# Patient Record
Sex: Female | Born: 2000
Health system: Southern US, Community
[De-identification: ages and names within clinical notes are randomized; demographics above are authoritative.]

---
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
## 2021-02-21 ENCOUNTER — Emergency Department (HOSPITAL_COMMUNITY)
Admission: EM | Admit: 2021-02-21 | Discharge: 2021-02-22 | Disposition: A | Discharge status: Home / Self Care | Payer: BC Managed Care – PPO | Attending: Emergency Medicine | Admitting: Emergency Medicine

## 2021-02-21 ENCOUNTER — Encounter (HOSPITAL_COMMUNITY): Payer: Self-pay | Admitting: Emergency Medicine

## 2021-02-21 ENCOUNTER — Emergency Department (HOSPITAL_COMMUNITY): Payer: BC Managed Care – PPO

## 2021-02-21 ENCOUNTER — Other Ambulatory Visit: Payer: Self-pay

## 2021-02-21 DIAGNOSIS — R072 Precordial pain: Secondary | ICD-10-CM | POA: Insufficient documentation

## 2021-02-22 ENCOUNTER — Encounter (HOSPITAL_COMMUNITY): Payer: Self-pay | Admitting: Emergency Medicine

## 2021-02-22 LAB — TROPONIN I (HIGH SENSITIVITY): Troponin I (High Sensitivity): <2 ng/L (ref <18)

## 2021-02-22 LAB — HCG, QUANTITATIVE, PREGNANCY: hCG, Beta Chain, Quant, S: <1 m[IU]/mL (ref <5)

## 2021-02-22 MED ORDER — ALUM & MAG HYDROXIDE-SIMETH 200-200-20 MG/5ML PO SUSP
30.0000 mL | Freq: Once | ORAL | Status: AC
  Administered 2021-02-22: 30 mL via ORAL
  Filled 2021-02-22: qty 30
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
## 2021-12-14 ENCOUNTER — Encounter (HOSPITAL_BASED_OUTPATIENT_CLINIC_OR_DEPARTMENT_OTHER): Payer: Self-pay | Admitting: Emergency Medicine

## 2021-12-14 ENCOUNTER — Emergency Department (HOSPITAL_BASED_OUTPATIENT_CLINIC_OR_DEPARTMENT_OTHER)
Admission: EM | Admit: 2021-12-14 | Discharge: 2021-12-15 | Disposition: A | Discharge status: Home / Self Care | Payer: BC Managed Care – PPO | Attending: Emergency Medicine | Admitting: Emergency Medicine

## 2021-12-14 ENCOUNTER — Other Ambulatory Visit: Payer: Self-pay

## 2021-12-14 ENCOUNTER — Emergency Department (HOSPITAL_BASED_OUTPATIENT_CLINIC_OR_DEPARTMENT_OTHER): Payer: BC Managed Care – PPO

## 2021-12-14 DIAGNOSIS — R5383 Other fatigue: Secondary | ICD-10-CM | POA: Insufficient documentation

## 2021-12-14 DIAGNOSIS — L709 Acne, unspecified: Secondary | ICD-10-CM | POA: Insufficient documentation

## 2021-12-14 DIAGNOSIS — R0609 Other forms of dyspnea: Secondary | ICD-10-CM | POA: Diagnosis not present

## 2021-12-14 DIAGNOSIS — M546 Pain in thoracic spine: Secondary | ICD-10-CM | POA: Diagnosis not present

## 2021-12-14 DIAGNOSIS — M7918 Myalgia, other site: Secondary | ICD-10-CM | POA: Diagnosis not present

## 2021-12-14 DIAGNOSIS — E876 Hypokalemia: Secondary | ICD-10-CM | POA: Diagnosis not present

## 2021-12-14 DIAGNOSIS — M549 Dorsalgia, unspecified: Secondary | ICD-10-CM

## 2021-12-14 LAB — URINALYSIS, ROUTINE W REFLEX MICROSCOPIC
Bilirubin Urine: NEGATIVE
Glucose, UA: NEGATIVE mg/dL
Hgb urine dipstick: NEGATIVE
Ketones, ur: 40 mg/dL — AB
Leukocytes,Ua: NEGATIVE
Nitrite: NEGATIVE
Protein, ur: NEGATIVE mg/dL
Specific Gravity, Urine: >=1.03 (ref 1.005–1.030)
pH: 5.5 (ref 5.0–8.0)

## 2021-12-14 LAB — PREGNANCY, URINE: Preg Test, Ur: NEGATIVE

## 2021-12-14 NOTE — ED Provider Notes (Signed)
[facility] EMERGENCY DEPARTMENT Provider Note   CSN: [id number] Arrival date & time: 12/14/21  2019     History {Add pertinent medical, surgical, social history, OB history to HPI:1} Chief Complaint  Patient presents with   Back Pain    Darlene Smith is a 21 y.o. female.  The history is provided by the patient.  Back Pain She has no significant past history and comes in with complaints of generalized fatigue, dyspnea on exertion, upper back pain.  Fatigue and dyspnea have been progressively getting worse over the last 6 months.  She denies fever, chills, sweats.  Over the last 3 days, she has had a cough productive of a small amount of clear sputum and is developed the pain across her upper back.  That pain is worse when she takes a deep breath.  She denies any anterior chest pain.  She denies any nausea, vomiting, diaphoresis.  She has had some intermittent rashes over the past 6 months.  She did show me a picture of a nonspecific rash on her left breast which lasted about 2 weeks before resolving, was itchy and may have been a contact dermatitis.  He has had some intermittent myalgias but she denies any arthralgias.  She smokes marijuana daily but states she has not smoked cigarettes in the last 2 months.  As she denies history of travel, surgery, exogenous estrogen use.   Home Medications Prior to Admission medications   Medication Sig Start Date End Date Taking? Authorizing Provider  cefdinir (OMNICEF) 300 MG capsule Take 300 mg by mouth 2 (two) times daily.    [provider]  fluticasone (FLONASE) 50 MCG/ACT nasal spray Place 2 sprays into both nostrils daily. 09/03/16   Liberty Handy, PA-C  naproxen (NAPROSYN) 500 MG tablet Take 1 tablet (500 mg total) by mouth 2 (two) times daily with a meal. 06/27/19   Caccavale, Sophia, PA-C  omeprazole (PRILOSEC) 20 MG capsule Take 1 capsule (20 mg total) by mouth daily. 11/27/17   Horton, Mayer Masker, MD  ondansetron  (ZOFRAN ODT) 4 MG disintegrating tablet Take 1 tablet (4 mg total) by mouth every 8 (eight) hours as needed for nausea or vomiting. 09/03/16   Liberty Handy, PA-C  predniSONE (DELTASONE) 20 MG tablet Take 20 mg by mouth daily with breakfast.    [provider]      Allergies    Sulfa antibiotics    Review of Systems   Review of Systems  Musculoskeletal:  Positive for back pain.  All other systems reviewed and are negative.  Physical Exam Updated Vital Signs BP 130/72 (BP Location: Right Arm)   Pulse (!) 54   Temp 98.2 F (36.8 C) (Oral)   Resp 16   Ht 5\' 9"  (1.753 m)   Wt 68 kg   LMP 11/14/2021 (Exact Date)   SpO2 100%   BMI 22.15 kg/m  Physical Exam Vitals and nursing note reviewed.  21 year old female, resting comfortably and in no acute distress. Vital signs are significant for slightly slow heart rate. Oxygen saturation is 100%, which is normal. Head is normocephalic and atraumatic. PERRLA, EOMI. Oropharynx is clear. Neck is nontender and supple without adenopathy or JVD. Back is nontender and there is no CVA tenderness. Lungs are clear without rales, wheezes, or rhonchi. Chest is nontender. Heart has regular rate and rhythm without murmur. Abdomen is soft, flat, nontender. Extremities have no cyanosis or edema, full range of motion is present. Skin is warm  and dry.  Mild facial acne is present but no other rashes seen. Neurologic: Mental status is normal, cranial nerves are intact, moves all extremities equally.  ED Results / Procedures / Treatments   Labs (all labs ordered are listed, but only abnormal results are displayed) Labs Reviewed  URINALYSIS, ROUTINE W REFLEX MICROSCOPIC - Abnormal; Notable for the following components:      Result Value   Ketones, ur 40 (*)    All other components within normal limits  PREGNANCY, URINE    EKG None  Radiology DG Chest 2 View  Result Date: 12/14/2021 CLINICAL DATA:  Productive cough EXAM: CHEST - 2  VIEW COMPARISON:  02/21/2021 FINDINGS: The heart size and mediastinal contours are within normal limits. Both lungs are clear. The visualized skeletal structures are unremarkable. IMPRESSION: No active cardiopulmonary disease. Electronically Signed   By: Jasmine Pang M.D.   On: 12/14/2021 23:41    Procedures Procedures  {Document cardiac monitor, telemetry assessment procedure when appropriate:1}  Medications Ordered in ED Medications - No data to display  ED Course/ Medical Decision Making/ A&P                           Medical Decision Making Amount and/or Complexity of Data Reviewed Labs: ordered. Radiology: ordered.   Fatigue, dyspnea on exertion are nonspecific.  Differential diagnosis includes chronic fatigue syndrome, hepatitis, autoimmune disorders such as lupus, heart failure.  Current cough is nonspecific.  Chest x-ray is obtained to rule out pneumonia.  X-ray shows no evidence of pneumonia.  Have independently viewed the images, and agree with radiologist's interpretation.  Urinalysis was also obtained to look for evidence of occult UTI and, per my interpretation, is significant only for presence of ketones.  There is no evidence of UTI.  Pregnancy test is negative.  I have ordered CBC and comprehensive metabolic panel to look for evidence of hepatitis or other chronic illnesses.  Also, will check D-dimer to rule out pulmonary embolism and BNP heart failure.  Final Clinical Impression(s) / ED Diagnoses Final diagnoses:  None    Rx / DC Orders ED Discharge Orders     None

## 2021-12-14 NOTE — ED Notes (Signed)
Pt describes chronic fatigue with URI and congestion intermittently since November.  Pt also has back pain with this and reports she is concerned that she might have pneumonia.

## 2021-12-14 NOTE — ED Triage Notes (Signed)
Patient reports has had intermittent back pain, felt fatigue, and skin has been itchy since November. Patient c/o increased back pain.

## 2021-12-15 LAB — CBC WITH DIFFERENTIAL/PLATELET
Abs Immature Granulocytes: 0.01 10*3/uL (ref 0.00–0.07)
Basophils Absolute: 0.1 10*3/uL (ref 0.0–0.1)
Basophils Relative: 1 %
Eosinophils Absolute: 0.1 10*3/uL (ref 0.0–0.5)
Eosinophils Relative: 2 %
HCT: 37.4 % (ref 36.0–46.0)
Hemoglobin: 12.8 g/dL (ref 12.0–15.0)
Immature Granulocytes: 0 %
Lymphocytes Relative: 20 %
Lymphs Abs: 1.4 10*3/uL (ref 0.7–4.0)
MCH: 29.8 pg (ref 26.0–34.0)
MCHC: 34.2 g/dL (ref 30.0–36.0)
MCV: 87.2 fL (ref 80.0–100.0)
Monocytes Absolute: 0.5 10*3/uL (ref 0.1–1.0)
Monocytes Relative: 7 %
Neutro Abs: 5.1 10*3/uL (ref 1.7–7.7)
Neutrophils Relative %: 70 %
Platelets: 226 10*3/uL (ref 150–400)
RBC: 4.29 MIL/uL (ref 3.87–5.11)
RDW: 12.8 % (ref 11.5–15.5)
WBC: 7.2 10*3/uL (ref 4.0–10.5)
nRBC: 0 % (ref 0.0–0.2)

## 2021-12-15 LAB — COMPREHENSIVE METABOLIC PANEL
ALT: 27 U/L (ref 0–44)
AST: 33 U/L (ref 15–41)
Albumin: 4.1 g/dL (ref 3.5–5.0)
Alkaline Phosphatase: 47 U/L (ref 38–126)
Anion gap: 6 (ref 5–15)
BUN: 6 mg/dL (ref 6–20)
CO2: 23 mmol/L (ref 22–32)
Calcium: 8.7 mg/dL — ABNORMAL LOW (ref 8.9–10.3)
Chloride: 108 mmol/L (ref 98–111)
Creatinine, Ser: 0.52 mg/dL (ref 0.44–1.00)
GFR, Estimated: >60 mL/min (ref >60)
Glucose, Bld: 114 mg/dL — ABNORMAL HIGH (ref 70–99)
Potassium: 3.2 mmol/L — ABNORMAL LOW (ref 3.5–5.1)
Sodium: 137 mmol/L (ref 135–145)
Total Bilirubin: 0.7 mg/dL (ref 0.3–1.2)
Total Protein: 7.2 g/dL (ref 6.5–8.1)

## 2021-12-15 LAB — BRAIN NATRIURETIC PEPTIDE: B Natriuretic Peptide: 14.9 pg/mL (ref 0.0–100.0)

## 2021-12-15 LAB — D-DIMER, QUANTITATIVE: D-Dimer, Quant: <0.27 ug/mL-FEU (ref 0.00–0.50)

## 2021-12-15 MED ORDER — POTASSIUM CHLORIDE CRYS ER 20 MEQ PO TBCR: 20.0000 meq | EXTENDED_RELEASE_TABLET | Freq: Two times a day (BID) | ORAL | 0 refills | Status: DC

## 2021-12-15 MED ORDER — POTASSIUM CHLORIDE CRYS ER 20 MEQ PO TBCR
40.0000 meq | EXTENDED_RELEASE_TABLET | Freq: Once | ORAL | Status: AC
  Administered 2021-12-15: 40 meq via ORAL
  Filled 2021-12-15: qty 2

## 2021-12-15 NOTE — ED Notes (Signed)
Pt requesting water, ok per RN Alvino Chapel. Pt given the same.

## 2021-12-15 NOTE — Discharge Instructions (Addendum)
Your evaluation today did not show any evidence of pneumonia or any other serious condition.  However, you should talk with your primary care provider about your symptoms.  You may need additional testing which your primary care provider can arrange.  You may take ibuprofen or naproxen as needed for pain.  If you need additional pain relief, add acetaminophen.  When you combine acetaminophen with either ibuprofen or naproxen, you get better pain relief than you get from taking either medication by itself.

## 2022-06-23 IMAGING — CR DG CHEST 2V
2 series · 2 of 2 positions shown · non-contrast
Comparison: 02/21/2021

CLINICAL DATA: Productive cough

EXAM:
CHEST - 2 VIEW

[w chest pa]
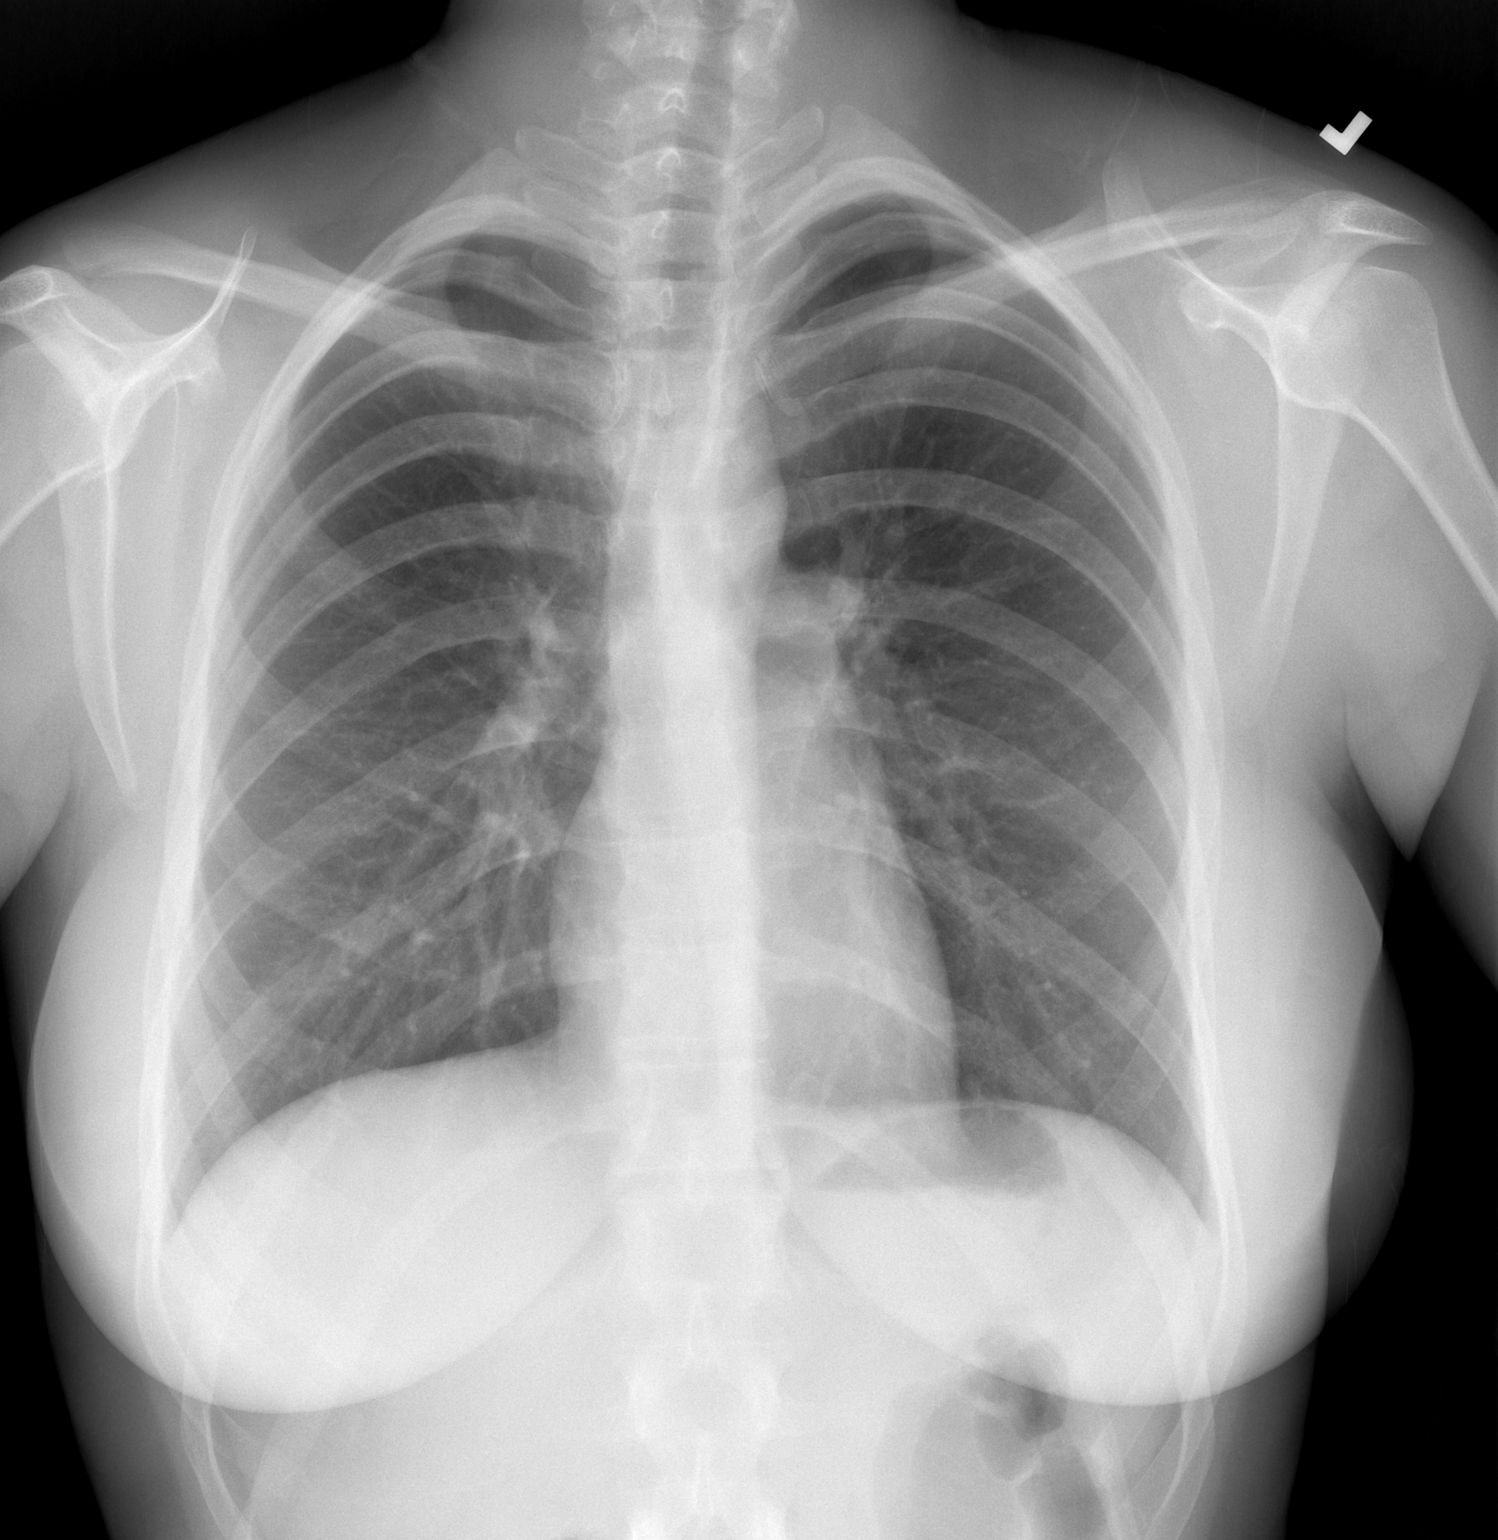

[w chest lat]
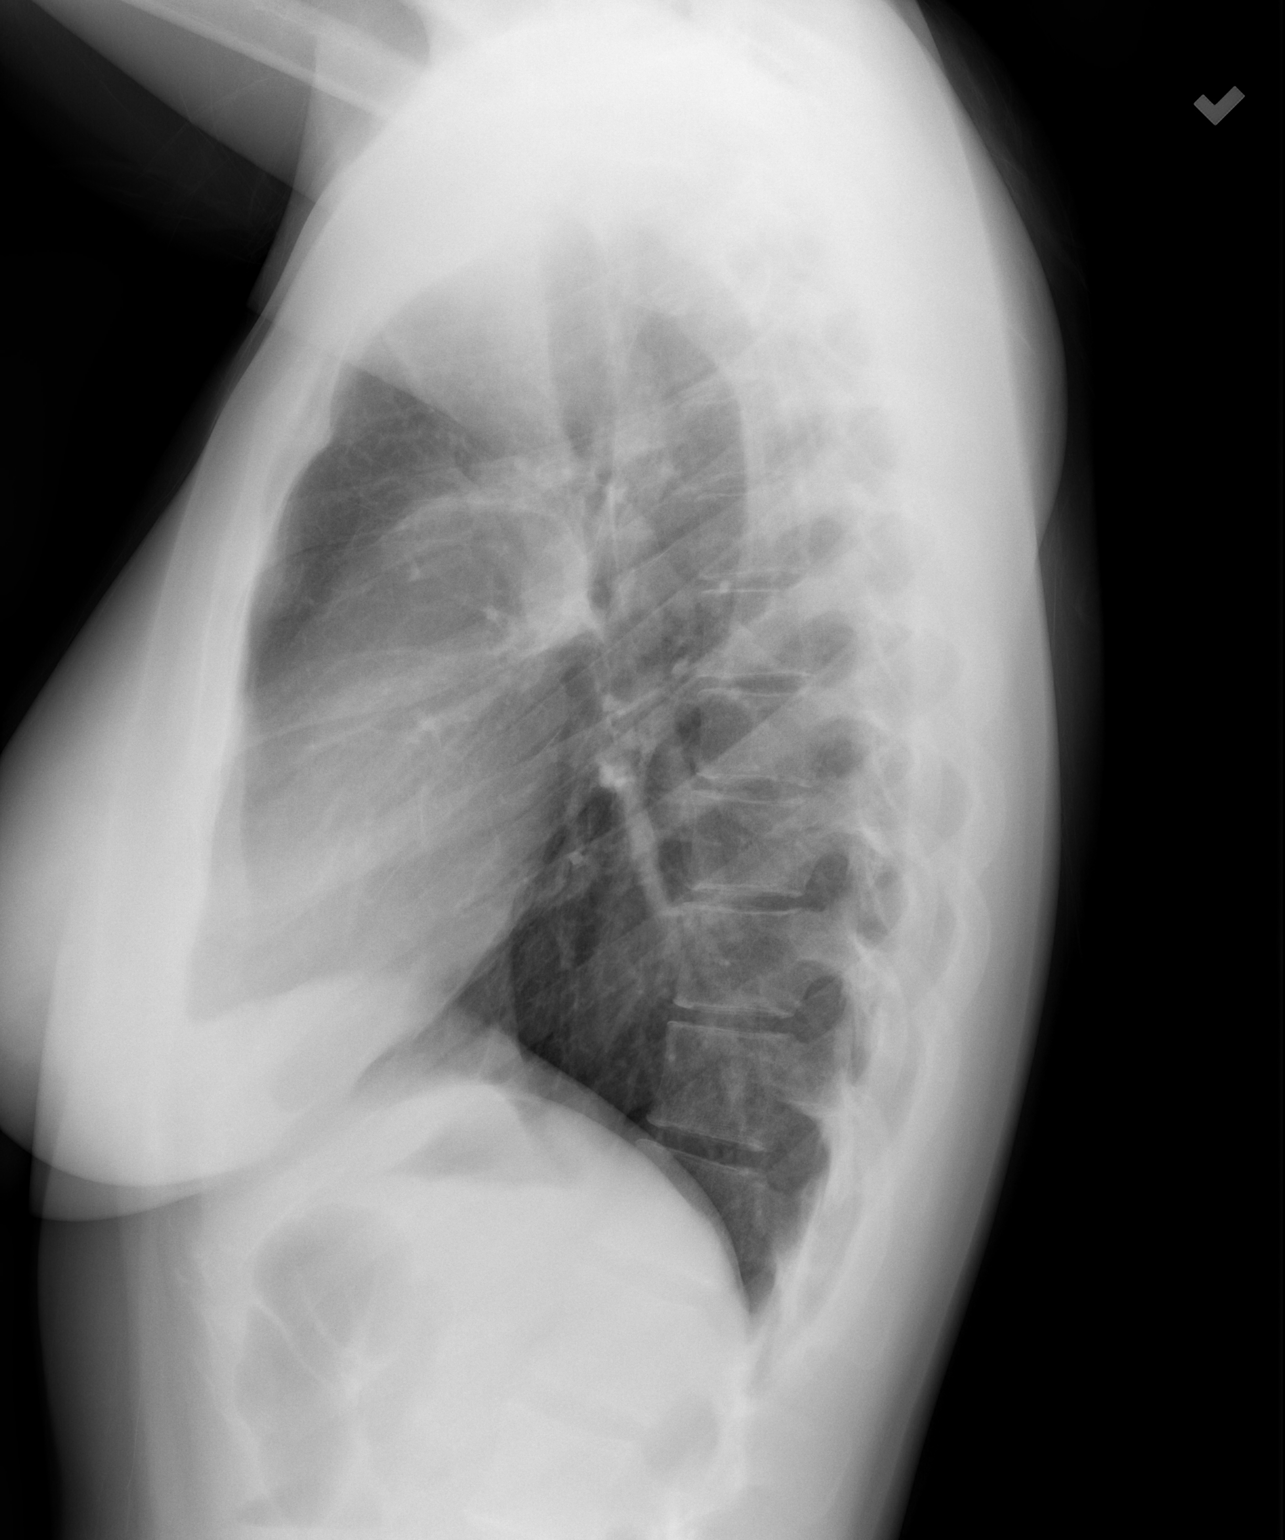

[2 of 2 positions shown; findings below may reference images not displayed]

FINDINGS: The heart size and mediastinal contours are within normal limits.
Both lungs are clear. The visualized skeletal structures are
unremarkable.
IMPRESSION: No active cardiopulmonary disease.

## 2022-10-29 ENCOUNTER — Emergency Department (HOSPITAL_BASED_OUTPATIENT_CLINIC_OR_DEPARTMENT_OTHER)
Admission: EM | Admit: 2022-10-29 | Discharge: 2022-10-29 | Disposition: A | Discharge status: Home / Self Care | Payer: BC Managed Care – PPO | Attending: Emergency Medicine | Admitting: Emergency Medicine

## 2022-10-29 ENCOUNTER — Encounter (HOSPITAL_BASED_OUTPATIENT_CLINIC_OR_DEPARTMENT_OTHER): Payer: Self-pay

## 2022-10-29 ENCOUNTER — Other Ambulatory Visit: Payer: Self-pay

## 2022-10-29 DIAGNOSIS — N3000 Acute cystitis without hematuria: Secondary | ICD-10-CM | POA: Insufficient documentation

## 2022-10-29 DIAGNOSIS — F17210 Nicotine dependence, cigarettes, uncomplicated: Secondary | ICD-10-CM | POA: Insufficient documentation

## 2022-10-29 DIAGNOSIS — R300 Dysuria: Secondary | ICD-10-CM | POA: Diagnosis present

## 2022-10-29 LAB — URINALYSIS, ROUTINE W REFLEX MICROSCOPIC
Bilirubin Urine: NEGATIVE
Glucose, UA: 100 mg/dL — AB
Hgb urine dipstick: NEGATIVE
Ketones, ur: NEGATIVE mg/dL
Nitrite: POSITIVE — AB
Protein, ur: NEGATIVE mg/dL
Specific Gravity, Urine: <=1.005 (ref 1.005–1.030)
pH: 5.5 (ref 5.0–8.0)

## 2022-10-29 LAB — PREGNANCY, URINE: Preg Test, Ur: NEGATIVE

## 2022-10-29 LAB — URINALYSIS, MICROSCOPIC (REFLEX): RBC / HPF: NONE SEEN RBC/hpf (ref 0–5)

## 2022-10-29 MED ORDER — CEPHALEXIN 250 MG PO CAPS
500.0000 mg | ORAL_CAPSULE | Freq: Once | ORAL | Status: AC
  Administered 2022-10-29: 500 mg via ORAL
  Filled 2022-10-29: qty 2

## 2022-10-29 MED ORDER — NAPROXEN 250 MG PO TABS
500.0000 mg | ORAL_TABLET | Freq: Once | ORAL | Status: AC
  Administered 2022-10-29: 500 mg via ORAL
  Filled 2022-10-29: qty 2

## 2022-10-29 MED ORDER — CEPHALEXIN 500 MG PO CAPS: 500.0000 mg | ORAL_CAPSULE | Freq: Four times a day (QID) | ORAL | 0 refills | Status: AC

## 2022-10-29 MED ORDER — NAPROXEN 500 MG PO TABS: 500.0000 mg | ORAL_TABLET | Freq: Two times a day (BID) | ORAL | 0 refills | Status: DC

## 2022-10-29 NOTE — ED Provider Notes (Signed)
[facility] Provider Note MRN:  [id number]  Arrival date & time: 10/29/22     Chief Complaint   Dysuria   History of Present Illness   Darlene ColanderKirstin Ballow is a 22 y.o. year-old female with no pertinent past medical history presenting to the ED with chief complaint of dysuria.  Burning discomfort, pain with urination for the past month.  Has been treated for yeast infection but this did not help.  Pain seems to be spreading to the bilateral flanks, right greater than left.  Discomfort in the suprapubic region worsening.  No fever.  No nausea vomiting or diarrhea.  No vaginal bleeding or discharge.  Review of Systems  A thorough review of systems was obtained and all systems are negative except as noted in the HPI and PMH.   Patient's Health History   History reviewed. No pertinent past medical history.  History reviewed. No pertinent surgical history.  History reviewed. No pertinent family history.  Social History   Socioeconomic History   Marital status: Single    Spouse name: Not on file   Number of children: Not on file   Years of education: Not on file   Highest education level: Not on file  Occupational History   Not on file  Tobacco Use   Smoking status: Some Days    Types: Cigarettes   Smokeless tobacco: Never  Vaping Use   Vaping Use: Former   Quit date: 10/14/2021  Substance and Sexual Activity   Alcohol use: Yes    Comment: occ   Drug use: Yes    Types: Marijuana, Cocaine   Sexual activity: Not on file  Other Topics Concern   Not on file  Social History Narrative   Not on file   Social Determinants of Health   Financial Resource Strain: Not on file  Food Insecurity: Not on file  Transportation Needs: Not on file  Physical Activity: Not on file  Stress: Not on file  Social Connections: Not on file  Intimate Partner Violence: Not on file     Physical Exam   Vitals:   10/29/22 2211  BP: (!) 122/56   Pulse: 64  Resp: 18  Temp: 97.6 F (36.4 C)  SpO2: 100%    CONSTITUTIONAL: Well-appearing, NAD NEURO/PSYCH:  Alert and oriented x 3, no focal deficits EYES:  eyes equal and reactive ENT/NECK:  no LAD, no JVD CARDIO: Regular rate, well-perfused, normal S1 and S2 PULM:  CTAB no wheezing or rhonchi GI/GU:  non-distended, non-tender MSK/SPINE:  No gross deformities, no edema SKIN:  no rash, atraumatic   *Additional and/or pertinent findings included in MDM below  Diagnostic and Interventional Summary    EKG Interpretation  Date/Time:    Ventricular Rate:    PR Interval:    QRS Duration:   QT Interval:    QTC Calculation:   R Axis:     Text Interpretation:         Labs Reviewed  URINALYSIS, ROUTINE W REFLEX MICROSCOPIC - Abnormal; Notable for the following components:      Result Value   Color, Urine ORANGE (*)    Glucose, UA 100 (*)    Nitrite POSITIVE (*)    Leukocytes,Ua TRACE (*)    All other components within normal limits  URINALYSIS, MICROSCOPIC (REFLEX) - Abnormal; Notable for the following components:   Bacteria, UA FEW (*)    All other components within normal limits  PREGNANCY, URINE    No orders  to display    Medications  cephALEXin (KEFLEX) capsule 500 mg (has no administration in time range)  naproxen (NAPROSYN) tablet 500 mg (has no administration in time range)     Procedures  /  Critical Care Procedures  ED Course and Medical Decision Making  Initial Impression and Ddx Dysuria, suspicious for UTI versus urethritis.  No vaginal bleeding or discharge.  Abdomen soft and nontender, no acute distress, ambulating in the room with normal vital signs.  Pyelonephritis also considered given the flank pain which is mild, doubt kidney stone.  Currently without indication for imaging.  Past medical/surgical history that increases complexity of ED encounter: None  Interpretation of Diagnostics I personally reviewed the urinalysis and my interpretation  is as follows: Nitrite positive, suspicious for urinary tract infection given the history, possibly pyelonephritis given the flank pain    Patient Reassessment and Ultimate Disposition/Management     Given patient's very reassuring exam and vital signs she is appropriate for discharge on antibiotics.  Patient management required discussion with the following services or consulting groups:  None  Complexity of Problems Addressed Acute complicated illness or Injury  Additional Data Reviewed and Analyzed Further history obtained from: Further history from spouse/family member  Additional Factors Impacting ED Encounter Risk Prescriptions  Elmer Sow. Pilar Plate, MD [facility] Emergency Medicine [facility] [email or web address]  Final Clinical Impressions(s) / ED Diagnoses     ICD-10-CM   1. Acute cystitis without hematuria  N30.00       ED Discharge Orders          Ordered    cephALEXin (KEFLEX) 500 MG capsule  4 times daily        10/29/22 2326    naproxen (NAPROSYN) 500 MG tablet  2 times daily        10/29/22 2326             Discharge Instructions Discussed with and Provided to Patient:    Discharge Instructions      You were evaluated in the Emergency Department and after careful evaluation, we did not find any emergent condition requiring admission or further testing in the hospital.  Your exam/testing today is overall reassuring.  Your symptoms are likely due to a urinary tract infection.  The infection has possibly spread to the kidney.  Important that you take the Keflex antibiotic as directed.  Use the Naprosyn twice daily as needed for pain.  You can continue using the phenazopyridine as needed for discomfort as well.  Please return to the Emergency Department if you experience any worsening of your condition.   Thank you for allowing [place] to be a part of your care.      Sabas Sous, MD 10/29/22 2330

## 2022-10-29 NOTE — ED Triage Notes (Signed)
Sts urinary frequency and dysuria for 1 month. Had visit 2 days ago for same but did not receive any medications for it.

## 2022-10-29 NOTE — Discharge Instructions (Addendum)
You were evaluated in the Emergency Department and after careful evaluation, we did not find any emergent condition requiring admission or further testing in the hospital.  Your exam/testing today is overall reassuring.  Your symptoms are likely due to a urinary tract infection.  The infection has possibly spread to the kidney.  Important that you take the Keflex antibiotic as directed.  Use the Naprosyn twice daily as needed for pain.  You can continue using the phenazopyridine as needed for discomfort as well.  Please return to the Emergency Department if you experience any worsening of your condition.   Thank you for allowing [place] to be a part of your care.

## 2022-11-01 ENCOUNTER — Emergency Department (HOSPITAL_BASED_OUTPATIENT_CLINIC_OR_DEPARTMENT_OTHER): Payer: BC Managed Care – PPO

## 2022-11-01 ENCOUNTER — Emergency Department (HOSPITAL_BASED_OUTPATIENT_CLINIC_OR_DEPARTMENT_OTHER)
Admission: EM | Admit: 2022-11-01 | Discharge: 2022-11-01 | Disposition: A | Discharge status: Home / Self Care | Payer: BC Managed Care – PPO | Attending: Emergency Medicine | Admitting: Emergency Medicine

## 2022-11-01 ENCOUNTER — Encounter (HOSPITAL_BASED_OUTPATIENT_CLINIC_OR_DEPARTMENT_OTHER): Payer: Self-pay | Admitting: Urology

## 2022-11-01 DIAGNOSIS — R300 Dysuria: Secondary | ICD-10-CM | POA: Diagnosis present

## 2022-11-01 DIAGNOSIS — N3090 Cystitis, unspecified without hematuria: Secondary | ICD-10-CM | POA: Insufficient documentation

## 2022-11-01 LAB — CBC WITH DIFFERENTIAL/PLATELET
Abs Immature Granulocytes: 0.01 10*3/uL (ref 0.00–0.07)
Basophils Absolute: 0 10*3/uL (ref 0.0–0.1)
Basophils Relative: 0 %
Eosinophils Absolute: 0.1 10*3/uL (ref 0.0–0.5)
Eosinophils Relative: 1 %
HCT: 36 % (ref 36.0–46.0)
Hemoglobin: 11.8 g/dL — ABNORMAL LOW (ref 12.0–15.0)
Immature Granulocytes: 0 %
Lymphocytes Relative: 30 %
Lymphs Abs: 2.1 10*3/uL (ref 0.7–4.0)
MCH: 28.2 pg (ref 26.0–34.0)
MCHC: 32.8 g/dL (ref 30.0–36.0)
MCV: 86.1 fL (ref 80.0–100.0)
Monocytes Absolute: 0.4 10*3/uL (ref 0.1–1.0)
Monocytes Relative: 6 %
Neutro Abs: 4.3 10*3/uL (ref 1.7–7.7)
Neutrophils Relative %: 63 %
Platelets: 235 10*3/uL (ref 150–400)
RBC: 4.18 MIL/uL (ref 3.87–5.11)
RDW: 13.7 % (ref 11.5–15.5)
WBC: 6.9 10*3/uL (ref 4.0–10.5)
nRBC: 0 % (ref 0.0–0.2)

## 2022-11-01 LAB — COMPREHENSIVE METABOLIC PANEL
ALT: 9 U/L (ref 0–44)
AST: 16 U/L (ref 15–41)
Albumin: 4.1 g/dL (ref 3.5–5.0)
Alkaline Phosphatase: 34 U/L — ABNORMAL LOW (ref 38–126)
Anion gap: 8 (ref 5–15)
BUN: 12 mg/dL (ref 6–20)
CO2: 21 mmol/L — ABNORMAL LOW (ref 22–32)
Calcium: 8.9 mg/dL (ref 8.9–10.3)
Chloride: 104 mmol/L (ref 98–111)
Creatinine, Ser: 0.65 mg/dL (ref 0.44–1.00)
GFR, Estimated: >60 mL/min (ref >60)
Glucose, Bld: 83 mg/dL (ref 70–99)
Potassium: 3.7 mmol/L (ref 3.5–5.1)
Sodium: 133 mmol/L — ABNORMAL LOW (ref 135–145)
Total Bilirubin: 0.6 mg/dL (ref 0.3–1.2)
Total Protein: 7.3 g/dL (ref 6.5–8.1)

## 2022-11-01 LAB — URINALYSIS, ROUTINE W REFLEX MICROSCOPIC
Bilirubin Urine: NEGATIVE
Glucose, UA: 100 mg/dL — AB
Hgb urine dipstick: NEGATIVE
Ketones, ur: NEGATIVE mg/dL
Leukocytes,Ua: NEGATIVE
Nitrite: POSITIVE — AB
Protein, ur: NEGATIVE mg/dL
Specific Gravity, Urine: 1.01 (ref 1.005–1.030)
pH: 6.5 (ref 5.0–8.0)

## 2022-11-01 LAB — URINALYSIS, MICROSCOPIC (REFLEX)

## 2022-11-01 LAB — WET PREP, GENITAL
Clue Cells Wet Prep HPF POC: NONE SEEN
Sperm: NONE SEEN
Trich, Wet Prep: NONE SEEN
WBC, Wet Prep HPF POC: <10 (ref <10)
Yeast Wet Prep HPF POC: NONE SEEN

## 2022-11-01 LAB — PREGNANCY, URINE: Preg Test, Ur: NEGATIVE

## 2022-11-01 MED ORDER — IOHEXOL 300 MG/ML  SOLN
100.0000 mL | Freq: Once | INTRAMUSCULAR | Status: AC | PRN
  Administered 2022-11-01: 100 mL via INTRAVENOUS

## 2022-11-01 MED ORDER — CIPROFLOXACIN HCL 500 MG PO TABS: 500.0000 mg | ORAL_TABLET | Freq: Two times a day (BID) | ORAL | 0 refills | Status: AC

## 2022-11-01 NOTE — ED Provider Notes (Signed)
[place] [facility] Provider Note   CSN: [id number] Arrival date & time: 11/01/22  1811     History  Chief Complaint  Patient presents with   Dysuria    Darlene Smith is a 22 y.o. female, no pertinent past medical history, who presents to the ED secondary to pelvic discomfort, increased frequency, urgency of urination, along with dysuria for the last month.  She states this has been going on for the last month, and her symptoms are not improving.  Saw someone on Friday, and was diagnosed with a UTI, started on Keflex, and Pyridium, she states that since then symptoms have not been improving.  He still having quite a bit of pain.  States it is mostly in bilateral lower quadrants and radiates to the back.  Was having some night sweats last night as well.  Of note her partner was recently diagnosed with herpes, and they have been sexually active.    Home Medications Prior to Admission medications   Medication Sig Start Date End Date Taking? Authorizing Provider  ciprofloxacin (CIPRO) 500 MG tablet Take 1 tablet (500 mg total) by mouth every 12 (twelve) hours for 7 days. 11/01/22 11/08/22 Yes Elgie Maziarz L, PA  cephALEXin (KEFLEX) 500 MG capsule Take 1 capsule (500 mg total) by mouth 4 (four) times daily for 10 days. 10/29/22 11/08/22  Sabas Sous, MD  fluticasone (FLONASE) 50 MCG/ACT nasal spray Place 2 sprays into both nostrils daily. 09/03/16   Liberty Handy, PA-C  naproxen (NAPROSYN) 500 MG tablet Take 1 tablet (500 mg total) by mouth 2 (two) times daily. 10/29/22   Sabas Sous, MD  omeprazole (PRILOSEC) 20 MG capsule Take 1 capsule (20 mg total) by mouth daily. 11/27/17   Horton, Mayer Masker, MD  ondansetron (ZOFRAN ODT) 4 MG disintegrating tablet Take 1 tablet (4 mg total) by mouth every 8 (eight) hours as needed for nausea or vomiting. 09/03/16   Liberty Handy, PA-C  potassium chloride SA (KLOR-CON M) 20 MEQ tablet Take 1 tablet (20 mEq  total) by mouth 2 (two) times daily. 12/15/21   Dione Booze, MD      Allergies    Sulfa antibiotics and Sulfamethoxazole    Review of Systems   Review of Systems  Genitourinary:  Positive for dysuria.    Physical Exam Updated Vital Signs BP 113/83 (BP Location: Left Arm)   Pulse 94   Temp 98 F (36.7 C)   Resp 18   Ht 5\' 9"  (1.753 m)   Wt 61.7 kg   LMP 10/15/2022 (Approximate)   SpO2 96%   BMI 20.08 kg/m  Physical Exam Vitals and nursing note reviewed. Exam conducted with a chaperone present.  Constitutional:      General: She is not in acute distress.    Appearance: She is well-developed.  HENT:     Head: Normocephalic and atraumatic.  Eyes:     Conjunctiva/sclera: Conjunctivae normal.  Cardiovascular:     Rate and Rhythm: Normal rate and regular rhythm.     Heart sounds: No murmur heard. Pulmonary:     Effort: Pulmonary effort is normal. No respiratory distress.     Breath sounds: Normal breath sounds.  Abdominal:     Palpations: Abdomen is soft.     Tenderness: There is no abdominal tenderness.  Genitourinary:    Vagina: Vaginal discharge present.     Cervix: No friability.     Comments: +mild erythema of cervical os, +  white discharge Musculoskeletal:        General: No swelling.     Cervical back: Neck supple.  Skin:    General: Skin is warm and dry.     Capillary Refill: Capillary refill takes less than 2 seconds.  Neurological:     Mental Status: She is alert.  Psychiatric:        Mood and Affect: Mood normal.     ED Results / Procedures / Treatments   Labs (all labs ordered are listed, but only abnormal results are displayed) Labs Reviewed  URINALYSIS, ROUTINE W REFLEX MICROSCOPIC - Abnormal; Notable for the following components:      Result Value   Color, Urine ORANGE (*)    Glucose, UA 100 (*)    Nitrite POSITIVE (*)    All other components within normal limits  URINALYSIS, MICROSCOPIC (REFLEX) - Abnormal; Notable for the following  components:   Bacteria, UA RARE (*)    All other components within normal limits  CBC WITH DIFFERENTIAL/PLATELET - Abnormal; Notable for the following components:   Hemoglobin 11.8 (*)    All other components within normal limits  COMPREHENSIVE METABOLIC PANEL - Abnormal; Notable for the following components:   Sodium 133 (*)    CO2 21 (*)    Alkaline Phosphatase 34 (*)    All other components within normal limits  WET PREP, GENITAL  PREGNANCY, URINE  GC/CHLAMYDIA PROBE AMP ([place]) NOT AT [facility]    EKG None  Radiology CT ABDOMEN PELVIS W CONTRAST  Result Date: 11/01/2022 CLINICAL DATA:  Acute abdominal pain EXAM: CT ABDOMEN AND PELVIS WITH CONTRAST TECHNIQUE: Multidetector CT imaging of the abdomen and pelvis was performed using the standard protocol following bolus administration of intravenous contrast. RADIATION DOSE REDUCTION: This exam was performed according to the departmental dose-optimization program which includes automated exposure control, adjustment of the mA and/or kV according to patient size and/or use of iterative reconstruction technique. CONTRAST:  OMNIPAQUE IOHEXOL 300 MG/ML  SOLN COMPARISON:  CT abdomen and pelvis 01/15/2017 FINDINGS: Lower chest: No acute abnormality. Hepatobiliary: No focal liver abnormality is seen. No gallstones, gallbladder wall thickening, or biliary dilatation. Pancreas: Unremarkable. No pancreatic ductal dilatation or surrounding inflammatory changes. Spleen: Normal in size without focal abnormality. Adrenals/Urinary Tract: Adrenal glands are unremarkable. Kidneys are normal, without renal calculi, focal lesion, or hydronephrosis. Bladder is unremarkable. Stomach/Bowel: Stomach is within normal limits. Appendix appears normal. No evidence of bowel wall thickening, distention, or inflammatory changes. There is a large amount of stool throughout the colon. Vascular/Lymphatic: No significant vascular findings are present. No enlarged abdominal  or pelvic lymph nodes. Reproductive: Uterus and bilateral adnexa are unremarkable. Dominant follicle noted in the right ovary. Other: No abdominal wall hernia or abnormality. No abdominopelvic ascites. Musculoskeletal: No acute or significant osseous findings. IMPRESSION: 1. No acute localizing process in the abdomen or pelvis. 2. Large amount of stool throughout the colon. Electronically Signed   By: Darliss Cheney M.D.   On: 11/01/2022 21:00    Procedures Procedures    Medications Ordered in ED Medications  iohexol (OMNIPAQUE) 300 MG/ML solution 100 mL (100 mLs Intravenous Contrast Given 11/01/22 2040)    ED Course/ Medical Decision Making/ A&P                             Medical Decision Making Patient is a 22 year old female, here for increased frequency urgency dysuria this morning for about a month.  She has had urine analysis, and started Pyridium and Keflex with no relief.  We will obtain a urinalysis and UPT, CT abdomen pelvis, to evaluate for possible stones, or large cyst, for further evaluation.  As well as do a pelvic and test for GC, wet prep.  Amount and/or Complexity of Data Reviewed Labs: ordered.    Details: Unremarkable, negative wet prep, urine grossly inaccurate given the use of Pyridium Radiology: ordered.    Details: CT abdomen pelvis shows no acute findings other than large amount of stool in colon. Discussion of management or test interpretation with external provider(s): Discussed with patient, urine difficult to read given use of Pyridium, we will treat her for UTI, with a ciprofloxacin, encouraged her to stop using Keflex Pyridium.  She is aware that she will need follow-up with the PCP.  And that her urine is not able to be cultured given her use of Pyridium, if her symptoms continue she will need to see a urologist for possible interstitial cystitis, and have her urine cultured.  We discussed the return precautions and she voiced understanding.  Was well-appearing on  exam.  She did not have any herpetic lesions on her cervix, and her cervix overall look good except for some mild inflammation.  She does not have any cervical motion tenderness thus I will not treat her for PID at this time.  Risk Prescription drug management.    Final Clinical Impression(s) / ED Diagnoses Final diagnoses:  Cystitis    Rx / DC Orders ED Discharge Orders          Ordered    ciprofloxacin (CIPRO) 500 MG tablet  Every 12 hours        11/01/22 2122              Pete PeltSmall, Hunt Zajicek L, PA 11/01/22 2125    Charlynne PanderYao, David Hsienta, MD 11/01/22 2337

## 2022-11-01 NOTE — Discharge Instructions (Addendum)
Please follow-up with your primary care doctor, we are unable to culture urine given your use of Azo.  I have prescribed a different antibiotic, for you, stop taking your Keflex, Pyridium, and the Azo prescribed by the urgent care.  If your pain continues, you will need to follow-up with another provider primary care doctor or the ER, you will need to likely have your urine cultured after you have been off Azo for a prolonged period of time.  Additionally it is possible you have interstitial cystitis, it is unclear, you will need to follow-up with the urologist regarding this, if it does not improve after taking the ciprofloxacin.  I provided you with information regarding this.

## 2022-11-01 NOTE — ED Notes (Signed)
Pt. Has been being treated for  a UTI and is not getting any better.  Pt. Reports having urinary frequency with odor to her urine.    Pt. Reports off and on low back pain.

## 2022-11-01 NOTE — ED Triage Notes (Signed)
Continued urinary symptoms after being seen Friday for same Dx with UTI, currently on antibiotics (keflex, naproxen, and Uro-MP) Had night sweats last night

## 2022-11-02 LAB — GC/CHLAMYDIA PROBE AMP (~~LOC~~) NOT AT ARMC
Chlamydia: NEGATIVE
Comment: NEGATIVE
Comment: NORMAL
Neisseria Gonorrhea: NEGATIVE

## 2023-03-08 ENCOUNTER — Other Ambulatory Visit: Payer: Self-pay

## 2023-03-08 ENCOUNTER — Encounter (HOSPITAL_BASED_OUTPATIENT_CLINIC_OR_DEPARTMENT_OTHER): Payer: Self-pay | Admitting: *Deleted

## 2023-03-08 ENCOUNTER — Emergency Department (HOSPITAL_BASED_OUTPATIENT_CLINIC_OR_DEPARTMENT_OTHER)
Admission: EM | Admit: 2023-03-08 | Discharge: 2023-03-08 | Disposition: A | Discharge status: Home / Self Care | Payer: BC Managed Care – PPO | Attending: Emergency Medicine | Admitting: Emergency Medicine

## 2023-03-08 DIAGNOSIS — R300 Dysuria: Secondary | ICD-10-CM | POA: Diagnosis present

## 2023-03-08 DIAGNOSIS — N3010 Interstitial cystitis (chronic) without hematuria: Secondary | ICD-10-CM | POA: Insufficient documentation

## 2023-03-08 LAB — BASIC METABOLIC PANEL
Anion gap: 9 (ref 5–15)
BUN: 6 mg/dL (ref 6–20)
CO2: 23 mmol/L (ref 22–32)
Calcium: 8.9 mg/dL (ref 8.9–10.3)
Chloride: 106 mmol/L (ref 98–111)
Creatinine, Ser: 0.59 mg/dL (ref 0.44–1.00)
GFR, Estimated: >60 mL/min (ref >60)
Glucose, Bld: 82 mg/dL (ref 70–99)
Potassium: 3.6 mmol/L (ref 3.5–5.1)
Sodium: 138 mmol/L (ref 135–145)

## 2023-03-08 LAB — URINALYSIS, W/ REFLEX TO CULTURE (INFECTION SUSPECTED)
Bilirubin Urine: NEGATIVE
Glucose, UA: NEGATIVE mg/dL
Ketones, ur: NEGATIVE mg/dL
Nitrite: POSITIVE — AB
Protein, ur: NEGATIVE mg/dL
Specific Gravity, Urine: 1.01 (ref 1.005–1.030)
pH: 7 (ref 5.0–8.0)

## 2023-03-08 LAB — CBC WITH DIFFERENTIAL/PLATELET
Abs Immature Granulocytes: 0.02 10*3/uL (ref 0.00–0.07)
Basophils Absolute: 0 10*3/uL (ref 0.0–0.1)
Basophils Relative: 0 %
Eosinophils Absolute: 0.1 10*3/uL (ref 0.0–0.5)
Eosinophils Relative: 1 %
HCT: 33.4 % — ABNORMAL LOW (ref 36.0–46.0)
Hemoglobin: 11.2 g/dL — ABNORMAL LOW (ref 12.0–15.0)
Immature Granulocytes: 0 %
Lymphocytes Relative: 26 %
Lymphs Abs: 1.8 10*3/uL (ref 0.7–4.0)
MCH: 28.6 pg (ref 26.0–34.0)
MCHC: 33.5 g/dL (ref 30.0–36.0)
MCV: 85.2 fL (ref 80.0–100.0)
Monocytes Absolute: 0.4 10*3/uL (ref 0.1–1.0)
Monocytes Relative: 6 %
Neutro Abs: 4.7 10*3/uL (ref 1.7–7.7)
Neutrophils Relative %: 67 %
Platelets: 206 10*3/uL (ref 150–400)
RBC: 3.92 MIL/uL (ref 3.87–5.11)
RDW: 14.3 % (ref 11.5–15.5)
WBC: 7 10*3/uL (ref 4.0–10.5)
nRBC: 0 % (ref 0.0–0.2)

## 2023-03-08 LAB — PREGNANCY, URINE: Preg Test, Ur: NEGATIVE

## 2023-03-08 LAB — WET PREP, GENITAL
Clue Cells Wet Prep HPF POC: NONE SEEN
Sperm: NONE SEEN
Trich, Wet Prep: NONE SEEN
WBC, Wet Prep HPF POC: >=10 — AB (ref <10)
Yeast Wet Prep HPF POC: NONE SEEN

## 2023-03-08 MED ORDER — HYDROXYZINE HCL 25 MG PO TABS: 25.0000 mg | ORAL_TABLET | Freq: Three times a day (TID) | ORAL | 0 refills | Status: DC | PRN

## 2023-03-08 MED ORDER — OXYCODONE HCL 5 MG PO TABS
5.0000 mg | ORAL_TABLET | Freq: Once | ORAL | Status: AC
  Administered 2023-03-08: 5 mg via ORAL
  Filled 2023-03-08 (×2): qty 1

## 2023-03-08 MED ORDER — HYDROXYZINE HCL 25 MG PO TABS
25.0000 mg | ORAL_TABLET | Freq: Once | ORAL | Status: AC
  Administered 2023-03-08: 25 mg via ORAL
  Filled 2023-03-08: qty 1

## 2023-03-08 MED ORDER — ACETAMINOPHEN 500 MG PO TABS
1000.0000 mg | ORAL_TABLET | Freq: Once | ORAL | Status: AC
  Administered 2023-03-08: 1000 mg via ORAL
  Filled 2023-03-08: qty 2

## 2023-03-08 MED ORDER — IBUPROFEN 400 MG PO TABS
600.0000 mg | ORAL_TABLET | Freq: Once | ORAL | Status: AC
  Administered 2023-03-08: 600 mg via ORAL
  Filled 2023-03-08: qty 1

## 2023-03-08 NOTE — Discharge Instructions (Addendum)
Thank you for coming to [facility]. You were seen for burning and frequency with urination. We did an exam, labs, and these showed no acute findings.  A urine culture and a G/C culture were also drawn.  You will be notified if these things are positive. You can alternate taking Tylenol and ibuprofen as needed for pain. You can take 650mg  tylenol (acetaminophen) every 4-6 hours, and 600 mg ibuprofen 3 times a day. You can also take hydroxyzine 25 mg every 8 hours as needed.  Please follow up with your primary care provider within 1-2 weeks.   Do not hesitate to return to the ED or call 911 if you experience: -Worsening symptoms -Lightheadedness, passing out -Fevers/chills -Anything else that concerns you

## 2023-03-08 NOTE — ED Provider Notes (Signed)
[place] [facility] Provider Note   CSN: [id number] Arrival date & time: 03/08/23  1757     History  Chief Complaint  Patient presents with   Cystitis    Darlene Smith is a 22 y.o. female with PMH as listed below who presents here by POV from UC, sent to ED from UC b/c of her discomfort, dysuria, frequency, some itching. H/o similar. Has seen urology for similar in past. States, "may have been dx'd with interstitial cystitis. Sx onset today at 1400. Took AZO at 1430. Denies fever,   Abdominal or pelvic pain, NVD, HA, dizziness. Endorses some intermittent vaginal itching but denies vaginal discharge.  She states she thinks she is having an interstitial cystitis flare which is the worst she has ever had.  She has not had a flare since she was presumptively diagnosed with IC and has never been treated for flare before. Sexually active only with her fiance, denies any recent new partners, no c/f STI. States hydroxyzine has helped in the past.   Per UC documentation from earlier today: 22 yo female reports hurts to sit because of increased pelvic pain. Denies STI concern and thinks IC flare. She states she has seen urology and was diagnosed with IC but was not given any direction or prescription to help with symptoms. She has been given hydroxyzine which has helped in the past. Denies discharge and notes LMP ended 2 days ago and was WNL. Only sexual partner is her fiance. Denies STI history. She took AZO today without any relief. Denies pain with intercourse.    History reviewed. No pertinent past medical history.     Home Medications Prior to Admission medications   Medication Sig Start Date End Date Taking? Authorizing Provider  hydrOXYzine (ATARAX) 25 MG tablet Take 1 tablet (25 mg total) by mouth every 8 (eight) hours as needed (for IC pain). 03/08/23 04/07/23 Yes Loetta Rough, MD  fluticasone (FLONASE) 50 MCG/ACT nasal spray Place 2 sprays into  both nostrils daily. 09/03/16   Liberty Handy, PA-C  naproxen (NAPROSYN) 500 MG tablet Take 1 tablet (500 mg total) by mouth 2 (two) times daily. 10/29/22   Sabas Sous, MD  omeprazole (PRILOSEC) 20 MG capsule Take 1 capsule (20 mg total) by mouth daily. 11/27/17   Horton, Mayer Masker, MD  ondansetron (ZOFRAN ODT) 4 MG disintegrating tablet Take 1 tablet (4 mg total) by mouth every 8 (eight) hours as needed for nausea or vomiting. 09/03/16   Liberty Handy, PA-C  potassium chloride SA (KLOR-CON M) 20 MEQ tablet Take 1 tablet (20 mEq total) by mouth 2 (two) times daily. 12/15/21   Dione Booze, MD      Allergies    Sulfa antibiotics and Sulfamethoxazole    Review of Systems   Review of Systems A 10 point review of systems was performed and is negative unless otherwise reported in HPI.  Physical Exam Updated Vital Signs BP 122/72 (BP Location: Left Arm)   Pulse (!) 58   Temp 98.3 F (36.8 C) (Oral)   Resp 18   LMP 02/15/2023 (Exact Date)   SpO2 98%  Physical Exam General: Normal appearing female, lying in bed.  HEENT: Sclera anicteric, MMM, trachea midline.  Cardiology: RRR Resp: Normal respiratory rate and effort.  Abd: Soft, non-tender, non-distended. No rebound tenderness or guarding.  Pelvic:  Performed with RN chaperone.  Normal-appearing external genitalia.  Small amount of brown blood in vaginal vault.  No  abnormal discharge or purulence noted in vaginal vault.  Normal-appearing cervix with closed os.  No purulence coming from os.  No CMT. MSK: No peripheral edema or signs of trauma. Extremities without deformity or TTP. No cyanosis or clubbing. Skin: warm, dry. Back: No CVA tenderness Neuro: A&Ox4, CNs II-XII grossly intact. MAEs. Sensation grossly intact.  Psych: Tearful affect  ED Results / Procedures / Treatments   Labs (all labs ordered are listed, but only abnormal results are displayed) Labs Reviewed  WET PREP, GENITAL - Abnormal; Notable for the following  components:      Result Value   WBC, Wet Prep HPF POC >=10 (*)    All other components within normal limits  URINALYSIS, W/ REFLEX TO CULTURE (INFECTION SUSPECTED) - Abnormal; Notable for the following components:   Hgb urine dipstick SMALL (*)    Nitrite POSITIVE (*)    Leukocytes,Ua TRACE (*)    Bacteria, UA FEW (*)    All other components within normal limits  CBC WITH DIFFERENTIAL/PLATELET - Abnormal; Notable for the following components:   Hemoglobin 11.2 (*)    HCT 33.4 (*)    All other components within normal limits  URINE CULTURE  PREGNANCY, URINE  BASIC METABOLIC PANEL  GC/CHLAMYDIA PROBE AMP ([place]) NOT AT [facility]    EKG None  Radiology No results found.  Procedures Pelvic exam  Date/Time: 03/08/2023 11:39 PM  Performed by: Loetta Rough, MD Authorized by: Loetta Rough, MD  Consent: Verbal consent obtained. Risks and benefits: risks, benefits and alternatives were discussed Consent given by: patient Required items: required blood products, implants, devices, and special equipment available Patient identity confirmed: verbally with patient Time out: Immediately prior to procedure a "time out" was called to verify the correct patient, procedure, equipment, support staff and site/side marked as required. Local anesthesia used: no  Anesthesia: Local anesthesia used: no  Sedation: Patient sedated: no  Patient tolerance: patient tolerated the procedure well with no immediate complications       Medications Ordered in ED Medications  hydrOXYzine (ATARAX) tablet 25 mg (25 mg Oral Given 03/08/23 2217)  acetaminophen (TYLENOL) tablet 1,000 mg (1,000 mg Oral Given 03/08/23 2216)  oxyCODONE (Oxy IR/ROXICODONE) immediate release tablet 5 mg (5 mg Oral Given 03/08/23 2217)  ibuprofen (ADVIL) tablet 600 mg (600 mg Oral Given 03/08/23 2216)    ED Course/ Medical Decision Making/ A&P                          Medical Decision Making Amount and/or Complexity  of Data Reviewed Labs: ordered. Decision-making details documented in ED Course.  Risk OTC drugs. Prescription drug management.    MDM:    Consider UTI/cystitis vs interstitial cystitis flare.  Patient states she has had the symptoms before but not so severe.  UA does not demonstrate any of UTI, urine cultures drawn.  Wet prep is negative and pelvic exam does not demonstrate any evidence of PID.  GC culture drawn for completeness sake though risk of STI per patient is low.  She is an otherwise healthy 22 year old and I have lower concern for genitourinary cancer.  She denies any urinary retention or feeling of incomplete urination, lower concern for bladder outlet obstruction.  She denies any abdominal pain or pelvic pain currently, unlikely to represent any ovarian torsion or pathology.  Patient likely with pain due to interstitial cystitis.  Patient's labs are reassuring.  She feels much better after hydroxyzine, Tylenol,  ibuprofen, and oxycodone.    Clinical Course as of 03/08/23 2338  Tue Mar 08, 2023  2132 WBC: 7.0 No leukocytosis [HN]  2132 Basic metabolic panel Normal renal function/electrolytes [HN]  2132 Urinalysis, w/ Reflex to Culture (Infection Suspected) -Urine, Clean Catch(!) +nitrites, few bacteria, few leukocytes, but no WBCs. No UTI. Will get urine culture as well given symptoms. [HN]  2314 Wet prep, genital(!) Neg wet prep. Will be DC'd with G/C pending, patient and fiance aware.  She feels much better after pain control.  Will be discharged with hydroxyzine 25 mg 3 times daily which she states helped significantly, and Tylenol ibuprofen.  Instructed to call her urologist in the morning to make a follow-up appointment.  Given discharge instructions and return precautions, all questions answered to patient satisfaction. [HN]    Clinical Course User Index [HN] Loetta Rough, MD    Labs: I Ordered, and personally interpreted labs.  The pertinent results include: Those  listed above  Additional history obtained from fianc at bedside, chart review.   Reevaluation: After the interventions noted above, I reevaluated the patient and found that they have :improved  Social Determinants of Health:  lives independently  Disposition: DC  Co morbidities that complicate the patient evaluation History reviewed. No pertinent past medical history.   Medicines Meds ordered this encounter  Medications   hydrOXYzine (ATARAX) tablet 25 mg   acetaminophen (TYLENOL) tablet 1,000 mg   oxyCODONE (Oxy IR/ROXICODONE) immediate release tablet 5 mg   ibuprofen (ADVIL) tablet 600 mg   hydrOXYzine (ATARAX) 25 MG tablet    Sig: Take 1 tablet (25 mg total) by mouth every 8 (eight) hours as needed (for IC pain).    Dispense:  30 tablet    Refill:  0    I have reviewed the patients home medicines and have made adjustments as needed  Problem List / ED Course: Problem List Items Addressed This Visit   None Visit Diagnoses     Pain due to interstitial cystitis    -  Primary                   This note was created using dictation software, which may contain spelling or grammatical errors.    Loetta Rough, MD 03/08/23 [id number]

## 2023-03-08 NOTE — ED Triage Notes (Signed)
Here by POV from UC, sent to ED from UC b/c of her discomfort, c/o constant bladder pain, dysuria, frequency, some itching. H/o similar. Has seen urology for similar in past. States, "may have been dx'd with interstitial cystitis. Sx onset today at 1400. Took AZO at 1430. Denies fever, NVD, HA, dizziness. Alert, restless, standing and rocking and bouncing.

## 2023-03-11 ENCOUNTER — Telehealth (HOSPITAL_BASED_OUTPATIENT_CLINIC_OR_DEPARTMENT_OTHER): Payer: Self-pay | Admitting: *Deleted

## 2023-03-11 NOTE — Progress Notes (Signed)
ED Antimicrobial Stewardship Positive Culture Follow Up   Darlene Smith is an 22 y.o. female who presented to [facility] on 03/08/2023 with a chief complaint of  Chief Complaint  Patient presents with   Cystitis    Recent Results (from the past 720 hour(s))  Urine Culture     Status: Abnormal   Collection Time: 03/08/23  9:33 PM   Specimen: Urine, Clean Catch  Result Value Ref Range Status   Specimen Description   Final    URINE, CLEAN CATCH Performed at [facility], [address] [place], [place] [address]    Special Requests   Final    NONE Performed at [facility], [address] [place], [place] [address]    Culture 50,000 COLONIES/mL STAPHYLOCOCCUS SAPROPHYTICUS (A)  Final   Report Status 03/10/2023 FINAL  Final   Organism ID, Bacteria STAPHYLOCOCCUS SAPROPHYTICUS (A)  Final      Susceptibility   Staphylococcus saprophyticus - MIC*    CIPROFLOXACIN <=0.5 SENSITIVE Sensitive     GENTAMICIN <=0.5 SENSITIVE Sensitive     NITROFURANTOIN <=16 SENSITIVE Sensitive     OXACILLIN SENSITIVE Sensitive     TETRACYCLINE >=16 RESISTANT Resistant     VANCOMYCIN <=0.5 SENSITIVE Sensitive     TRIMETH/SULFA <=10 SENSITIVE Sensitive     CLINDAMYCIN >=8 RESISTANT Resistant     RIFAMPIN <=0.5 SENSITIVE Sensitive     Inducible Clindamycin NEGATIVE Sensitive     * 50,000 COLONIES/mL STAPHYLOCOCCUS SAPROPHYTICUS  Wet prep, genital     Status: Abnormal   Collection Time: 03/08/23 11:00 PM  Result Value Ref Range Status   Yeast Wet Prep HPF POC NONE SEEN NONE SEEN Final   Trich, Wet Prep NONE SEEN NONE SEEN Final   Clue Cells Wet Prep HPF POC NONE SEEN NONE SEEN Final   WBC, Wet Prep HPF POC >=10 (A) <10 Final   Sperm NONE SEEN  Final    Comment: Performed at [facility], [address] [place], [place] [address]    Patient sent home with hydroxyzine for interstitial cystitis, no antibiotics. No treatment of bacteriuria currently indicated, follow  up outpatient with urology.  ED Provider: Ralph Leyden, PA-C   Romie Minus, PharmD PGY1 Pharmacy Resident Monday - Friday phone -  [phone] Saturday - Sunday phone - [phone]

## 2023-03-11 NOTE — Telephone Encounter (Signed)
Post ED Visit - Positive Culture Follow-up  Culture report reviewed by antimicrobial stewardship pharmacist: [facility] []  [address], Pharm.D. []  Celedonio Miyamoto, Pharm.D., BCPS AQ-ID []  Garvin Fila, Pharm.D., BCPS []  Georgina Pillion, [address].D., BCPS []  [place], [address].D., BCPS, AAHIVP []  Estella Husk, Pharm.D., BCPS, AAHIVP []  Lysle Pearl, PharmD, BCPS []  Phillips Climes, PharmD, BCPS []  Agapito Games, PharmD, BCPS []  Verlan Friends, PharmD []  Mervyn Gay, PharmD, BCPS []  Vinnie Level, PharmD  [facility] []  Len Childs, PharmD []  Greer Pickerel, PharmD []  Adalberto Cole, PharmD []  Perlie Gold, Rph []  Lonell Face) Jean Rosenthal, PharmD []  Earl Many, PharmD []  Junita Push, PharmD []  Dorna Leitz, PharmD []  Terrilee Files, PharmD []  Lynann Beaver, PharmD []  Keturah Barre, PharmD []  Loralee Pacas, PharmD []  Bernadene Person, PharmD   Positive urine culture Treated with Hydroxyzine and follow up with Urology and no further patient follow-up is required at this time.  Virl Axe [facility] 03/11/2023, 10:28 AM

## 2023-03-24 ENCOUNTER — Other Ambulatory Visit: Payer: Self-pay

## 2023-03-24 ENCOUNTER — Encounter (HOSPITAL_BASED_OUTPATIENT_CLINIC_OR_DEPARTMENT_OTHER): Payer: Self-pay | Admitting: Urology

## 2023-03-24 ENCOUNTER — Emergency Department (HOSPITAL_BASED_OUTPATIENT_CLINIC_OR_DEPARTMENT_OTHER)
Admission: EM | Admit: 2023-03-24 | Discharge: 2023-03-24 | Disposition: A | Discharge status: Home / Self Care | Payer: BC Managed Care – PPO | Attending: Emergency Medicine | Admitting: Emergency Medicine

## 2023-03-24 DIAGNOSIS — R300 Dysuria: Secondary | ICD-10-CM | POA: Diagnosis present

## 2023-03-24 LAB — WET PREP, GENITAL
Sperm: NONE SEEN
Trich, Wet Prep: NONE SEEN
WBC, Wet Prep HPF POC: <10 (ref <10)
Yeast Wet Prep HPF POC: NONE SEEN

## 2023-03-24 LAB — URINALYSIS, ROUTINE W REFLEX MICROSCOPIC
Bilirubin Urine: NEGATIVE
Glucose, UA: NEGATIVE mg/dL
Hgb urine dipstick: NEGATIVE
Ketones, ur: NEGATIVE mg/dL
Leukocytes,Ua: NEGATIVE
Nitrite: NEGATIVE
Protein, ur: NEGATIVE mg/dL
Specific Gravity, Urine: 1.01 (ref 1.005–1.030)
pH: 7 (ref 5.0–8.0)

## 2023-03-24 LAB — PREGNANCY, URINE: Preg Test, Ur: NEGATIVE

## 2023-03-24 NOTE — ED Provider Notes (Signed)
[place] [facility] AT [facility] Provider Note   CSN: [id number] Arrival date & time: 03/24/23  1749     History  Chief Complaint  Patient presents with   Dysuria    Darlene Smith is a 22 y.o. female.  Patient presents to the emergency department today for evaluation of dysuria.  Patient has had similar symptoms in the past.  She was prescribed a antibiotic 1 to 2 weeks ago for "5 days" which "muffled" her symptoms but did not complete because of the resolved.  She states that she went back and she still had bacteria in her urine so she was prescribed a "stronger" antibiotic.  She is nearing completion of this antibiotic and continues to have burning at the beginning and end of her urine stream.  No bleeding.  No back pain or fevers.  No vomiting.  No hematuria.  She is tried Azo without relief.  She states that she has seen a urologist in the past and they were "leaning towards interstitial cystitis".  She is sexually active with 1 partner.  Overall she feels she is low risk for STI.  She denies vaginal bleeding or discharge.       Home Medications Prior to Admission medications   Medication Sig Start Date End Date Taking? Authorizing Provider  fluticasone (FLONASE) 50 MCG/ACT nasal spray Place 2 sprays into both nostrils daily. 09/03/16   Liberty Handy, PA-C  hydrOXYzine (ATARAX) 25 MG tablet Take 1 tablet (25 mg total) by mouth every 8 (eight) hours as needed (for IC pain). 03/08/23 04/07/23  Loetta Rough, MD  naproxen (NAPROSYN) 500 MG tablet Take 1 tablet (500 mg total) by mouth 2 (two) times daily. 10/29/22   Sabas Sous, MD  omeprazole (PRILOSEC) 20 MG capsule Take 1 capsule (20 mg total) by mouth daily. 11/27/17   Horton, Mayer Masker, MD  ondansetron (ZOFRAN ODT) 4 MG disintegrating tablet Take 1 tablet (4 mg total) by mouth every 8 (eight) hours as needed for nausea or vomiting. 09/03/16   Liberty Handy, PA-C  potassium chloride SA (KLOR-CON  M) 20 MEQ tablet Take 1 tablet (20 mEq total) by mouth 2 (two) times daily. 12/15/21   Dione Booze, MD      Allergies    Sulfa antibiotics and Sulfamethoxazole    Review of Systems   Review of Systems  Physical Exam Updated Vital Signs BP (!) 102/56 (BP Location: Right Arm)   Pulse 61   Temp 98.8 F (37.1 C) (Oral)   Resp 18   Ht 5\' 9"  (1.753 m)   Wt 57.2 kg   LMP 02/15/2023 (Exact Date)   SpO2 98%   BMI 18.61 kg/m   Physical Exam Vitals and nursing note reviewed.  Constitutional:      Appearance: She is well-developed.  HENT:     Head: Normocephalic and atraumatic.  Eyes:     Conjunctiva/sclera: Conjunctivae normal.  Pulmonary:     Effort: No respiratory distress.  Abdominal:     Tenderness: There is no abdominal tenderness. There is no guarding or rebound.  Musculoskeletal:     Cervical back: Normal range of motion and neck supple.  Skin:    General: Skin is warm and dry.  Neurological:     Mental Status: She is alert.     ED Results / Procedures / Treatments   Labs (all labs ordered are listed, but only abnormal results are displayed) Labs Reviewed  WET PREP, GENITAL -  Abnormal; Notable for the following components:      Result Value   Clue Cells Wet Prep HPF POC PRESENT (*)    All other components within normal limits  URINE CULTURE  URINALYSIS, ROUTINE W REFLEX MICROSCOPIC  PREGNANCY, URINE  GC/CHLAMYDIA PROBE AMP ([place]) NOT AT [facility]    EKG None  Radiology No results found.  Procedures Procedures    Medications Ordered in ED Medications - No data to display  ED Course/ Medical Decision Making/ A&P    Patient seen and examined. History obtained directly from patient. Work-up including labs, imaging, EKG ordered in triage, if performed, were reviewed.    Labs/EKG: Independently reviewed and interpreted.  This included: UA and urine pregnancy were negative.  Added wet prep and GC/chlamydia, urine culture.  Imaging: None  ordered  Medications/Fluids: None ordered  Most recent vital signs reviewed and are as follows: BP (!) 102/56 (BP Location: Right Arm)   Pulse 61   Temp 98.8 F (37.1 C) (Oral)   Resp 18   Ht 5\' 9"  (1.753 m)   Wt 57.2 kg   LMP 02/15/2023 (Exact Date)   SpO2 98%   BMI 18.61 kg/m   Initial impression: Dysuria  9:48 PM Reassessment performed. Patient appears stable, comfortable.  Labs personally reviewed and interpreted including: Wet prep, agree with findings.  GC/chlamydia pending.  Reviewed pertinent lab work and imaging with patient at bedside. Questions answered.   Most current vital signs reviewed and are as follows: BP (!) 102/56 (BP Location: Right Arm)   Pulse 61   Temp 98.8 F (37.1 C) (Oral)   Resp 18   Ht 5\' 9"  (1.753 m)   Wt 57.2 kg   LMP 02/15/2023 (Exact Date)   SpO2 98%   BMI 18.61 kg/m   Plan: Discharge to home.   Prescriptions written for: None  Other home care instructions discussed: Monitor symptoms  ED return instructions discussed: Return with back pain, fever, vomiting, worsening abdominal pain  Follow-up instructions discussed: Patient encouraged to follow-up with their PCP in 3 days.                                  Medical Decision Making Amount and/or Complexity of Data Reviewed Labs: ordered.   Patient with dysuria despite recent treatment for UTI.  UA is clear today.  GC/chlamydia testing is pending.  Wet prep without signs of yeast infection or trichomonas.  Clue cells positive, however patient's overall symptom profile was not consistent with bacterial vaginosis and I have low likelihood of this.  Encourage PCP follow-up as needed.  Abdominal exam is reassuring.  Low concern for significant intra-abdominal etiology such as appendicitis or other intra-abdominal infection.  Low concern for pyelonephritis.        Final Clinical Impression(s) / ED Diagnoses Final diagnoses:  Dysuria    Rx / DC Orders ED Discharge Orders      None         Renne Crigler, Cordelia Poche 03/24/23 2150    Arby Barrette, MD 04/08/23 1816

## 2023-03-24 NOTE — ED Triage Notes (Signed)
Dx with UTI 2 weeks ago, has been through 2 courses of antibiotics, still has occasional pain  States lower back pain intermittent x 1 week now  Denies fever

## 2023-03-24 NOTE — Discharge Instructions (Signed)
Please read and follow all provided instructions.  Your diagnoses today include:  1. Dysuria     Tests performed today include: Urine test -is clear today without signs of infection Urine culture was sent and is pending, you will be notified with any positive results suggestive of infection. Test for gonorrhea and chlamydia is pending Wet prep did not show trichomonas or yeast infection.  Was positive for clue cells but clinically you do not have symptoms which would suggest bacterial vaginosis. Vital signs. See below for your results today.   Medications prescribed:  None  Home care instructions:  Follow any educational materials contained in this packet.  Follow-up instructions: Please follow-up with your primary care provider in 3 days if symptoms are not resolved for further evaluation of your symptoms.  Return instructions:  Please return to the Emergency Department if you experience worsening symptoms.  Return with fever, worsening pain, persistent vomiting, worsening pain in your back.  Please return if you have any other emergent concerns.  Additional Information:  Your vital signs today were: BP (!) 102/56 (BP Location: Right Arm)   Pulse 61   Temp 98.8 F (37.1 C) (Oral)   Resp 18   Ht 5\' 9"  (1.753 m)   Wt 57.2 kg   LMP 02/15/2023 (Exact Date)   SpO2 98%   BMI 18.61 kg/m  If your blood pressure (BP) was elevated above 135/85 this visit, please have this repeated by your doctor within one month. --------------

## 2023-03-25 LAB — URINE CULTURE: Culture: NO GROWTH

## 2023-03-31 LAB — GC/CHLAMYDIA PROBE AMP (~~LOC~~) NOT AT ARMC
Chlamydia: NEGATIVE
Comment: NEGATIVE
Comment: NORMAL
Neisseria Gonorrhea: NEGATIVE

## 2023-04-01 ENCOUNTER — Ambulatory Visit (INDEPENDENT_AMBULATORY_CARE_PROVIDER_SITE_OTHER): Payer: BC Managed Care – PPO | Admitting: Physician Assistant

## 2023-04-01 ENCOUNTER — Encounter: Payer: Self-pay | Admitting: Physician Assistant

## 2023-04-01 VITALS — BP 108/62 | HR 63 | Temp 98.2°F | Resp 20 | Ht 68.0 in | Wt 125.6 lb

## 2023-04-01 DIAGNOSIS — N760 Acute vaginitis: Secondary | ICD-10-CM | POA: Diagnosis not present

## 2023-04-01 DIAGNOSIS — D649 Anemia, unspecified: Secondary | ICD-10-CM | POA: Diagnosis not present

## 2023-04-01 DIAGNOSIS — B9689 Other specified bacterial agents as the cause of diseases classified elsewhere: Secondary | ICD-10-CM | POA: Diagnosis not present

## 2023-04-01 DIAGNOSIS — Z23 Encounter for immunization: Secondary | ICD-10-CM | POA: Diagnosis not present

## 2023-04-01 DIAGNOSIS — N3010 Interstitial cystitis (chronic) without hematuria: Secondary | ICD-10-CM | POA: Diagnosis not present

## 2023-04-01 NOTE — Progress Notes (Signed)
New patient visit   Patient: Darlene Smith   DOB: 2000/10/12   22 y.o. Female  MRN: [id number] Visit Date: 04/01/2023  Today's healthcare provider: Alfredia Ferguson, PA-C   Cc. New patient  Subjective    Darlene Smith is a 23 y.o. female who presents today as a new patient to establish care.  HPI  Pt was seen in the ED 8/29 for dysuria, urine culture negative, but wet prep + for clue cells but given symptoms she was not treated.   In the last month she was treated with macrobid, cefodoxime. Her lasturine culture grew staph saprophyticus.   Discussed the use of AI scribe software for clinical note transcription with the patient, who gave verbal consent to proceed.  History of Present Illness   The patient, with a history of interstitial cystitis presents with ongoing bladder issues. The patient describes the bladder problem as chronic inflammation that flares up with certain foods, particularly spicy and heavily processed foods. The flare-ups are usually mild and can be managed by drinking lots of water. However, the patient recently had a severe flare-up that was later diagnosed as a UTI.    The patient also reports changes in her fingers, which she describes as becoming more round and having a dip on the outside. Concerned about finger clubbing. The patient denies any pain in her fingers.      History reviewed. No pertinent past medical history. History reviewed. No pertinent surgical history. Family Status  Relation Name Status   Mother  Alive   Father  Alive  No partnership data on file   History reviewed. No pertinent family history. Social History   Socioeconomic History   Marital status: Significant Other    Spouse name: Not on file   Number of children: Not on file   Years of education: Not on file   Highest education level: Not on file  Occupational History   Not on file  Tobacco Use   Smoking status: Some Days    Types: Cigarettes   Smokeless tobacco:  Never  Vaping Use   Vaping status: Former   Quit date: 10/14/2021  Substance and Sexual Activity   Alcohol use: Yes    Comment: occ   Drug use: Yes    Types: Marijuana, Cocaine   Sexual activity: Not on file  Other Topics Concern   Not on file  Social History Narrative   Not on file   Social Determinants of Health   Financial Resource Strain: Not on file  Food Insecurity: Not on file  Transportation Needs: Not on file  Physical Activity: Not on file  Stress: Not on file  Social Connections: Unknown (12/08/2021)   Received from [facility] [address], [facility]   Social Network    Social Network: Not on file   Outpatient Medications Prior to Visit  Medication Sig   AUROVELA 24 FE 1-20 MG-MCG(24) tablet Take 1 tablet by mouth daily.   fluconazole (DIFLUCAN) 150 MG tablet Take 150 mg by mouth once.   metroNIDAZOLE (FLAGYL) 500 MG tablet Take 500 mg by mouth 3 (three) times daily.   [DISCONTINUED] fluticasone (FLONASE) 50 MCG/ACT nasal spray Place 2 sprays into both nostrils daily.   [DISCONTINUED] hydrOXYzine (ATARAX) 25 MG tablet Take 1 tablet (25 mg total) by mouth every 8 (eight) hours as needed (for IC pain).   [DISCONTINUED] naproxen (NAPROSYN) 500 MG tablet Take 1 tablet (500 mg total) by mouth 2 (two) times daily.   [DISCONTINUED] omeprazole (PRILOSEC)  20 MG capsule Take 1 capsule (20 mg total) by mouth daily.   [DISCONTINUED] ondansetron (ZOFRAN ODT) 4 MG disintegrating tablet Take 1 tablet (4 mg total) by mouth every 8 (eight) hours as needed for nausea or vomiting.   [DISCONTINUED] potassium chloride SA (KLOR-CON M) 20 MEQ tablet Take 1 tablet (20 mEq total) by mouth 2 (two) times daily.   No facility-administered medications prior to visit.   Allergies  Allergen Reactions   Sulfa Antibiotics Anaphylaxis, Rash and Shortness Of Breath    Blood clotting issues per mother "blood won't clot"    Sulfamethoxazole Rash and Shortness Of Breath    "blood won't clot"     Immunization History  Administered Date(s) Administered   DTaP 04/04/2001, 06/12/2001, 08/07/2001, 09/18/2002, 08/10/2005   Dtap, Unspecified 07/07/2006   HIB, Unspecified 04/04/2001, 06/12/2001, 08/07/2001, 05/24/2002   HPV 9-valent 04/01/2023   Hep B, Unspecified 2000-12-31, 04/04/2001, 03/29/2002   IPV 04/04/2001, 06/12/2001, 09/18/2002, 08/10/2005   MMR 05/24/2002, 07/07/2006   MMRV 08/10/2005   Meningococcal Acwy, Unspecified 11/03/2011, 06/09/2017   Meningococcal Conjugate 06/09/2017   PFIZER Comirnaty(Gray Top)Covid-19 Tri-Sucrose Vaccine 11/01/2019, 11/22/2019   PPD Test 11/10/2022   Pneumococcal Conjugate PCV 7 04/04/2001, 06/12/2001, 08/07/2001, 03/29/2002   Polio, Unspecified 07/07/2006   Td 04/01/2023   Tdap 09/27/2011   Varicella 03/29/2002, 04/29/2014    Health Maintenance  Topic Date Due   HIV Screening  Never done   Hepatitis C Screening  Never done   PAP-Cervical Cytology Screening  Never done   PAP SMEAR-Modifier  Never done   INFLUENZA VACCINE  Never done   COVID-19 Vaccine (3 - 2023-24 season) 03/27/2023   HPV VACCINES (2 - 3-dose series) 04/29/2023   DTaP/Tdap/Td (8 - Td or Tdap) 03/31/2033    Patient Care Team: Alfredia Ferguson, PA-C as PCP - General (Physician Assistant)  Review of Systems  Constitutional:  Negative for fatigue and fever.  Respiratory:  Negative for cough and shortness of breath.   Cardiovascular:  Negative for chest pain and leg swelling.  Gastrointestinal:  Negative for abdominal pain.  Genitourinary:  Positive for frequency and urgency.  Neurological:  Negative for dizziness and headaches.      Objective    BP 108/62 (BP Location: Left Arm, Patient Position: Sitting, Cuff Size: Small)   Pulse 63   Temp 98.2 F (36.8 C) (Oral)   Resp 20   Ht 5\' 8"  (1.727 m)   Wt 125 lb 9.6 oz (57 kg)   LMP 02/15/2023 (Exact Date)   SpO2 99%   BMI 19.10 kg/m   Physical Exam Constitutional:      General: She is awake.      Appearance: She is well-developed.  HENT:     Head: Normocephalic.  Eyes:     Conjunctiva/sclera: Conjunctivae normal.  Cardiovascular:     Rate and Rhythm: Normal rate and regular rhythm.     Heart sounds: Normal heart sounds.  Pulmonary:     Effort: Pulmonary effort is normal.     Breath sounds: Normal breath sounds.  Musculoskeletal:     Comments: B/l hands appear normal. No clubbing at fingers  Skin:    General: Skin is warm.  Neurological:     Mental Status: She is alert and oriented to person, place, and time.  Psychiatric:        Attention and Perception: Attention normal.        Mood and Affect: Mood normal.        Speech: Speech normal.  Behavior: Behavior is cooperative.     Depression Screen    04/01/2023    8:47 AM  PHQ 2/9 Scores  PHQ - 2 Score 1   No results found for any visits on 04/01/23.  Assessment & Plan     1. Interstitial cystitis -Continue avoidance of trigger foods and maintain high water intake. -Consider use of hydroxyzine as needed for severe flare-ups. -Consider daily antihistamine (e.g., Zyrtec or Claritin) for management. -Consider referral to specialty group or urogynecologist if symptoms become unmanageable.  2. Anemia, unspecified type Recent blood work showed mild anemia. No heavy periods. Reports frequent tiredness and feeling cold. -Start iron supplement or increase dietary iron intake (leafy greens, meat).  3. BV (bacterial vaginosis) On review of recent ED visit-- positive for BV with clue cells seen -Pt already picked up course of flagyl and diflucan   Nail Changes -No intervention needed at this time. Monitor for any further changes or development of pain.  General Health Maintenance -Administer tetanus and first dose of HPV (Gardasil) vaccine today. Schedule follow-up doses of HPV vaccine in 1 month and 6 months. -Consider routine physical later in the year or next summer, in line with timing of last blood  work. -Ensure regular Pap smears with gynecologist.    4. Need for vaccine for DT (diphtheria-tetanus) - Td : Tetanus/diphtheria >7yo Preservative  free  5. Need for HPV vaccine - HPV 9-valent vaccine,Recombinat  Assessment and Plan     Return in about 6 months (around 09/29/2023) for CPE.     I, Alfredia Ferguson, PA-C have reviewed all documentation for this visit. The documentation on  04/01/23   for the exam, diagnosis, procedures, and orders are all accurate and complete.   Alfredia Ferguson, PA-C  [facility] [phone] (phone) [phone] (fax)  [facility]

## 2023-05-02 ENCOUNTER — Encounter: Payer: Self-pay | Admitting: Physician Assistant

## 2023-05-02 ENCOUNTER — Ambulatory Visit (INDEPENDENT_AMBULATORY_CARE_PROVIDER_SITE_OTHER): Payer: BC Managed Care – PPO | Admitting: Family Medicine

## 2023-05-02 ENCOUNTER — Encounter: Payer: Self-pay | Admitting: Family Medicine

## 2023-05-02 VITALS — BP 98/70 | HR 72 | Temp 98.5°F | Resp 18 | Ht 68.0 in | Wt 122.2 lb

## 2023-05-02 DIAGNOSIS — N3000 Acute cystitis without hematuria: Secondary | ICD-10-CM

## 2023-05-02 DIAGNOSIS — R300 Dysuria: Secondary | ICD-10-CM

## 2023-05-02 LAB — POC URINALSYSI DIPSTICK (AUTOMATED)
Bilirubin, UA: NEGATIVE
Glucose, UA: NEGATIVE
Ketones, UA: NEGATIVE
Leukocytes, UA: NEGATIVE
Nitrite, UA: NEGATIVE
Protein, UA: NEGATIVE
Spec Grav, UA: 1.01 (ref 1.010–1.025)
Urobilinogen, UA: 0.2 U/dL
pH, UA: 5 (ref 5.0–8.0)

## 2023-05-02 MED ORDER — PHENAZOPYRIDINE HCL 200 MG PO TABS
200.0000 mg | ORAL_TABLET | Freq: Three times a day (TID) | ORAL | 3 refills | Status: AC | PRN
Start: 2023-05-02 — End: ?

## 2023-05-02 MED ORDER — NITROFURANTOIN MONOHYD MACRO 100 MG PO CAPS
100.0000 mg | ORAL_CAPSULE | Freq: Two times a day (BID) | ORAL | 0 refills | Status: AC
Start: 2023-05-02 — End: ?

## 2023-05-02 NOTE — Telephone Encounter (Signed)

## 2023-05-02 NOTE — Telephone Encounter (Signed)
Please disregard encounter. Pt in office seeing another provider today.

## 2023-05-02 NOTE — Progress Notes (Signed)
Established Patient Office Visit  Subjective   Patient ID: Darlene Smith, female    DOB: July 28, 2000  Age: 22 y.o. MRN: [id number]  Chief Complaint  Patient presents with   Dysuria    Sxs started few days ago. Pt states having burning and pain that started this morning.     HPI Discussed the use of AI scribe software for clinical note transcription with the patient, who gave verbal consent to proceed.  History of Present Illness   The patient, with a history of recurrent urinary tract infections (UTIs) and interstitial cystitis, presents with recent onset of cloudy urine. Initially, she did not experience any discomfort and attempted to manage the symptoms with increased water intake. However, this morning she experienced a burning sensation during urination. The urine was described as 'super cloudy' and had a noticeable odor. After urination, the burning sensation subsided. She denies any unusual discharge, but notes that she is currently at the end of her menstrual cycle. She has a known history of interstitial cystitis, but she is able to differentiate between the symptoms of a UTI and a flare-up of interstitial cystitis. She has not consumed any spicy or acidic foods recently, which she knows can exacerbate her interstitial cystitis.      Patient Active Problem List   Diagnosis Date Noted   Interstitial cystitis 04/01/2023   No past medical history on file. No past surgical history on file. Social History   Tobacco Use   Smoking status: Some Days    Types: Cigarettes   Smokeless tobacco: Never  Vaping Use   Vaping status: Former   Quit date: 10/14/2021  Substance Use Topics   Alcohol use: Yes    Comment: occ   Drug use: Yes    Types: Marijuana, Cocaine   Social History   Socioeconomic History   Marital status: Significant Other    Spouse name: Not on file   Number of children: Not on file   Years of education: Not on file   Highest education level: Not on file   Occupational History   Not on file  Tobacco Use   Smoking status: Some Days    Types: Cigarettes   Smokeless tobacco: Never  Vaping Use   Vaping status: Former   Quit date: 10/14/2021  Substance and Sexual Activity   Alcohol use: Yes    Comment: occ   Drug use: Yes    Types: Marijuana, Cocaine   Sexual activity: Not on file  Other Topics Concern   Not on file  Social History Narrative   Not on file   Social Determinants of Health   Financial Resource Strain: Not on file  Food Insecurity: Not on file  Transportation Needs: Not on file  Physical Activity: Not on file  Stress: Not on file  Social Connections: Unknown (12/08/2021)   Received from [facility], [facility]   Social Network    Social Network: Not on file  Intimate Partner Violence: Unknown (10/30/2021)   Received from [facility], [facility]   HITS    Physically Hurt: Not on file    Insult or Talk Down To: Not on file    Threaten Physical Harm: Not on file    Scream or Curse: Not on file   Family Status  Relation Name Status   Mother  Alive   Father  Alive  No partnership data on file   No family history on file. Allergies  Allergen Reactions   Sulfa Antibiotics Anaphylaxis,  Rash and Shortness Of Breath    Blood clotting issues per mother "blood won't clot"    Sulfamethoxazole Rash and Shortness Of Breath    "blood won't clot"      Review of Systems  Constitutional:  Negative for fever and malaise/fatigue.  HENT:  Negative for congestion.   Eyes:  Negative for blurred vision.  Respiratory:  Negative for cough and shortness of breath.   Cardiovascular:  Negative for chest pain, palpitations and leg swelling.  Gastrointestinal:  Negative for abdominal pain, blood in stool, nausea and vomiting.  Genitourinary:  Negative for dysuria and frequency.  Musculoskeletal:  Negative for back pain and falls.  Skin:  Negative for rash.  Neurological:  Negative for dizziness, loss of consciousness  and headaches.  Endo/Heme/Allergies:  Negative for environmental allergies.  Psychiatric/Behavioral:  Negative for depression. The patient is not nervous/anxious.       Objective:     BP 98/70 (BP Location: Left Arm, Patient Position: Sitting, Cuff Size: Normal)   Pulse 72   Temp 98.5 F (36.9 C) (Oral)   Resp 18   Ht 5\' 8"  (1.727 m)   Wt 122 lb 3.2 oz (55.4 kg)   SpO2 97%   BMI 18.58 kg/m  BP Readings from Last 3 Encounters:  05/02/23 98/70  04/01/23 108/62  03/24/23 108/62   Wt Readings from Last 3 Encounters:  05/02/23 122 lb 3.2 oz (55.4 kg)  04/01/23 125 lb 9.6 oz (57 kg)  03/24/23 126 lb (57.2 kg)   SpO2 Readings from Last 3 Encounters:  05/02/23 97%  04/01/23 99%  03/24/23 100%      Physical Exam Vitals and nursing note reviewed.  Constitutional:      General: She is not in acute distress.    Appearance: Normal appearance. She is well-developed.  HENT:     Head: Normocephalic and atraumatic.  Eyes:     General: No scleral icterus.       Right eye: No discharge.        Left eye: No discharge.  Cardiovascular:     Rate and Rhythm: Normal rate and regular rhythm.     Heart sounds: No murmur heard. Pulmonary:     Effort: Pulmonary effort is normal. No respiratory distress.     Breath sounds: Normal breath sounds.  Abdominal:     Tenderness: There is no abdominal tenderness. There is no guarding or rebound.  Musculoskeletal:        General: Normal range of motion.     Cervical back: Normal range of motion and neck supple.     Right lower leg: No edema.     Left lower leg: No edema.  Skin:    General: Skin is warm and dry.  Neurological:     Mental Status: She is alert and oriented to person, place, and time.  Psychiatric:        Mood and Affect: Mood normal.        Behavior: Behavior normal.        Thought Content: Thought content normal.        Judgment: Judgment normal.      Results for orders placed or performed in visit on 05/02/23  POCT  Urinalysis Dipstick (Automated)  Result Value Ref Range   Color, UA yellow    Clarity, UA cloudy    Glucose, UA Negative Negative   Bilirubin, UA negative    Ketones, UA negative    Spec Grav, UA 1.010 1.010 - 1.025  Blood, UA moderate    pH, UA 5.0 5.0 - 8.0   Protein, UA Negative Negative   Urobilinogen, UA 0.2 0.2 or 1.0 E.U./dL   Nitrite, UA negative    Leukocytes, UA Negative Negative    Last CBC Lab Results  Component Value Date   WBC 7.0 03/08/2023   HGB 11.2 (L) 03/08/2023   HCT 33.4 (L) 03/08/2023   MCV 85.2 03/08/2023   MCH 28.6 03/08/2023   RDW 14.3 03/08/2023   PLT 206 03/08/2023   Last metabolic panel Lab Results  Component Value Date   GLUCOSE 82 03/08/2023   NA 138 03/08/2023   K 3.6 03/08/2023   CL 106 03/08/2023   CO2 23 03/08/2023   BUN 6 03/08/2023   CREATININE 0.59 03/08/2023   GFRNONAA >60 03/08/2023   CALCIUM 8.9 03/08/2023   PROT 7.3 11/01/2022   ALBUMIN 4.1 11/01/2022   BILITOT 0.6 11/01/2022   ALKPHOS 34 (L) 11/01/2022   AST 16 11/01/2022   ALT 9 11/01/2022   ANIONGAP 9 03/08/2023   Last lipids No results found for: "CHOL", "HDL", "LDLCALC", "LDLDIRECT", "TRIG", "CHOLHDL" Last hemoglobin A1c No results found for: "HGBA1C" Last thyroid functions No results found for: "TSH", "T3TOTAL", "T4TOTAL", "THYROIDAB" Last vitamin D No results found for: "25OHVITD2", "25OHVITD3", "VD25OH" Last vitamin B12 and Folate No results found for: "VITAMINB12", "FOLATE"    The ASCVD Risk score (Arnett DK, et al., 2019) failed to calculate for the following reasons:   The 2019 ASCVD risk score is only valid for ages 68 to 43    Assessment & Plan:   Problem List Items Addressed This Visit   None Visit Diagnoses     Dysuria    -  Primary   Relevant Medications   nitrofurantoin, macrocrystal-monohydrate, (MACROBID) 100 MG capsule   phenazopyridine (PYRIDIUM) 200 MG tablet   Other Relevant Orders   POCT Urinalysis Dipstick (Automated)  (Completed)   Urine Culture     Assessment and Plan    Suspected Urinary Tract Infection Reports of cloudy, foul-smelling urine with dysuria. History of recurrent UTIs and interstitial cystitis. Urinalysis shows significant blood. -Send urine for culture and sensitivity. -Start Nitrofurantoin (Macrobid) pending culture results. -Prescribe Pyridium for symptomatic relief of dysuria.  Interstitial Cystitis Chronic condition, patient reports ability to differentiate between UTI and interstitial cystitis flare-ups. No current severe flare-up reported. -Continue current management plan.        No follow-ups on file.    Donato Schultz, DO

## 2023-05-03 LAB — URINE CULTURE
MICRO NUMBER:: 15560870
Result:: NO GROWTH
SPECIMEN QUALITY:: ADEQUATE
